# Patient Record
Sex: Female | Born: 1965 | Race: Black or African American | Hispanic: No | Smoking: Never smoker
Health system: Southern US, Community
[De-identification: ages and names within clinical notes are randomized; demographics above are authoritative.]

## PROBLEM LIST (undated history)

## (undated) DIAGNOSIS — I10 Essential (primary) hypertension: Secondary | ICD-10-CM

---

## 1998-10-20 ENCOUNTER — Emergency Department (HOSPITAL_COMMUNITY): Admission: EM | Admit: 1998-10-20 | Discharge: 1998-10-20 | Payer: Self-pay | Admitting: Emergency Medicine

## 2007-08-15 ENCOUNTER — Emergency Department: Payer: Self-pay | Admitting: Emergency Medicine

## 2009-04-04 ENCOUNTER — Emergency Department: Payer: Self-pay | Admitting: Emergency Medicine

## 2009-04-06 ENCOUNTER — Emergency Department: Payer: Self-pay | Admitting: Emergency Medicine

## 2009-07-25 ENCOUNTER — Emergency Department: Payer: Self-pay | Admitting: Emergency Medicine

## 2011-06-22 ENCOUNTER — Emergency Department: Payer: Self-pay | Admitting: Emergency Medicine

## 2013-04-22 ENCOUNTER — Emergency Department: Payer: Self-pay | Admitting: Internal Medicine

## 2013-04-22 LAB — BASIC METABOLIC PANEL
ANION GAP: 5 — AB (ref 7–16)
BUN: 13 mg/dL (ref 7–18)
Calcium, Total: 8.9 mg/dL (ref 8.5–10.1)
Chloride: 104 mmol/L (ref 98–107)
Co2: 30 mmol/L (ref 21–32)
Creatinine: 1.14 mg/dL (ref 0.60–1.30)
EGFR (Non-African Amer.): 57 — ABNORMAL LOW
Glucose: 94 mg/dL (ref 65–99)
OSMOLALITY: 277 (ref 275–301)
Potassium: 3.5 mmol/L (ref 3.5–5.1)
Sodium: 139 mmol/L (ref 136–145)

## 2013-04-22 LAB — CBC WITH DIFFERENTIAL/PLATELET
Basophil #: 0.1 10*3/uL (ref 0.0–0.1)
Basophil %: 1.1 %
EOS ABS: 0.3 10*3/uL (ref 0.0–0.7)
Eosinophil %: 5.4 %
HCT: 37.5 % (ref 35.0–47.0)
HGB: 11.6 g/dL — ABNORMAL LOW (ref 12.0–16.0)
Lymphocyte #: 2.6 10*3/uL (ref 1.0–3.6)
Lymphocyte %: 44.2 %
MCH: 23.3 pg — ABNORMAL LOW (ref 26.0–34.0)
MCHC: 31 g/dL — ABNORMAL LOW (ref 32.0–36.0)
MCV: 75 fL — AB (ref 80–100)
Monocyte #: 0.3 x10 3/mm (ref 0.2–0.9)
Monocyte %: 5.8 %
Neutrophil #: 2.5 10*3/uL (ref 1.4–6.5)
Neutrophil %: 43.5 %
PLATELETS: 327 10*3/uL (ref 150–440)
RBC: 4.98 10*6/uL (ref 3.80–5.20)
RDW: 14.6 % — AB (ref 11.5–14.5)
WBC: 5.8 10*3/uL (ref 3.6–11.0)

## 2013-04-22 LAB — URINALYSIS, COMPLETE
Bacteria: NONE SEEN
Bilirubin,UR: NEGATIVE
GLUCOSE, UR: NEGATIVE mg/dL (ref 0–75)
Ketone: NEGATIVE
Leukocyte Esterase: NEGATIVE
Nitrite: NEGATIVE
PROTEIN: NEGATIVE
Ph: 8 (ref 4.5–8.0)
RBC,UR: 2 /HPF (ref 0–5)
SPECIFIC GRAVITY: 1.012 (ref 1.003–1.030)
Squamous Epithelial: 3
WBC UR: <1 /HPF (ref 0–5)

## 2013-04-22 LAB — TROPONIN I: Troponin-I: <0.02 ng/mL

## 2014-10-03 ENCOUNTER — Encounter: Payer: Self-pay | Admitting: Emergency Medicine

## 2014-10-03 ENCOUNTER — Other Ambulatory Visit: Payer: Self-pay

## 2014-10-03 DIAGNOSIS — R51 Headache: Secondary | ICD-10-CM | POA: Insufficient documentation

## 2014-10-03 DIAGNOSIS — I10 Essential (primary) hypertension: Secondary | ICD-10-CM | POA: Insufficient documentation

## 2014-10-03 DIAGNOSIS — R079 Chest pain, unspecified: Secondary | ICD-10-CM | POA: Insufficient documentation

## 2014-10-03 DIAGNOSIS — R42 Dizziness and giddiness: Secondary | ICD-10-CM | POA: Insufficient documentation

## 2014-10-03 NOTE — ED Notes (Signed)
Pt arrived to the ED for complaints of headache, dizziness and weakness for the last 7 days. Pt reports that she has been feeling under the weather and wants to get it checked. Pt is AOx4, no motor deficits or any stroke like symptoms noted in triage.

## 2014-10-04 ENCOUNTER — Emergency Department: Payer: No Typology Code available for payment source

## 2014-10-04 ENCOUNTER — Emergency Department
Admission: EM | Admit: 2014-10-04 | Discharge: 2014-10-04 | Disposition: A | Discharge status: Home / Self Care | Payer: No Typology Code available for payment source | Attending: Emergency Medicine | Admitting: Emergency Medicine

## 2014-10-04 DIAGNOSIS — R079 Chest pain, unspecified: Secondary | ICD-10-CM

## 2014-10-04 DIAGNOSIS — R42 Dizziness and giddiness: Secondary | ICD-10-CM

## 2014-10-04 HISTORY — DX: Essential (primary) hypertension: I10

## 2014-10-04 LAB — URINALYSIS COMPLETE WITH MICROSCOPIC (ARMC ONLY)
Bilirubin Urine: NEGATIVE
GLUCOSE, UA: NEGATIVE mg/dL
HGB URINE DIPSTICK: NEGATIVE
Ketones, ur: NEGATIVE mg/dL
Leukocytes, UA: NEGATIVE
Nitrite: NEGATIVE
Protein, ur: NEGATIVE mg/dL
Specific Gravity, Urine: 1.024 (ref 1.005–1.030)
pH: 5 (ref 5.0–8.0)

## 2014-10-04 LAB — BASIC METABOLIC PANEL
Anion gap: 5 (ref 5–15)
BUN: 16 mg/dL (ref 6–20)
CHLORIDE: 104 mmol/L (ref 101–111)
CO2: 30 mmol/L (ref 22–32)
CREATININE: 1.18 mg/dL — AB (ref 0.44–1.00)
Calcium: 9.5 mg/dL (ref 8.9–10.3)
GFR calc Af Amer: >60 mL/min (ref >60)
GFR calc non Af Amer: 53 mL/min — ABNORMAL LOW (ref >60)
Glucose, Bld: 96 mg/dL (ref 65–99)
Potassium: 3.8 mmol/L (ref 3.5–5.1)
Sodium: 139 mmol/L (ref 135–145)

## 2014-10-04 LAB — CBC
HCT: 37.2 % (ref 35.0–47.0)
Hemoglobin: 12 g/dL (ref 12.0–16.0)
MCH: 23.9 pg — AB (ref 26.0–34.0)
MCHC: 32.3 g/dL (ref 32.0–36.0)
MCV: 74 fL — AB (ref 80.0–100.0)
PLATELETS: 331 10*3/uL (ref 150–440)
RBC: 5.02 MIL/uL (ref 3.80–5.20)
RDW: 14.5 % (ref 11.5–14.5)
WBC: 5.6 10*3/uL (ref 3.6–11.0)

## 2014-10-04 LAB — TROPONIN I

## 2014-10-04 MED ORDER — ASPIRIN EC 81 MG PO TBEC: 81.0000 mg | DELAYED_RELEASE_TABLET | Freq: Every day | ORAL | Status: AC

## 2014-10-04 NOTE — ED Provider Notes (Signed)
[facility] Emergency Department Provider Note  ____________________________________________  Time seen: Approximately 1220 AM  I have reviewed the triage vital signs and the nursing notes.   HISTORY  Chief Complaint Dizziness; Hypertension; and Headache    HPI Pamela Townsend is a 49 y.o. female with a history of hypertension who presents today with 1 week of intermittent lightheadedness as well as a left-sided headache. She says that she feels lightheaded as if she will pass out for only a second or 2 at a time. She says that she can fill his anytime of day such as when she is sitting. She says that she is also had chest pains which last for only about a second at a time which have been a left-sided chest and shooting down the left arm. There is no associated shortness of breath. No nausea or vomiting. Said that her left-sided headache is about a 3 out of 10 and intermittent. Says is only mild at this time. Denies any chest pain at this time. Denies dizziness despite her nursing note upon arrival. Says that she does not feel the room spinning but her "dizziness" is more of a lightheadedness or "swimmy headedness." She said that she currently feels the sensation in the room but it is mild. She is able to ambulate back to the exam room without difficulty. Denies any coronary artery disease in her family. Says that the symptoms have been ongoing for about a week at this point.   Past Medical History  Diagnosis Date  . Hypertension     There are no active problems to display for this patient.   History reviewed. No pertinent past surgical history.  Current Outpatient Rx  Name  Route  Sig  Dispense  Refill  . ibuprofen (ADVIL,MOTRIN) 200 MG tablet   Oral   Take 400 mg by mouth as needed.           Allergies Review of patient's allergies indicates no known allergies.  No family history on file.  Social History Social History  Substance Use Topics  .  Smoking status: Never Smoker   . Smokeless tobacco: None  . Alcohol Use: Yes    Review of Systems Constitutional: No fever/chills Eyes: No visual changes. ENT: No sore throat. Cardiovascular: As above  Respiratory: Denies shortness of breath. Gastrointestinal: No abdominal pain.  No nausea, no vomiting.  No diarrhea.  No constipation. Genitourinary: Negative for dysuria. Musculoskeletal: Negative for back pain. Skin: Negative for rash. Neurological: Negative for focal weakness or numbness.  10-point ROS otherwise negative.  ____________________________________________   PHYSICAL EXAM:  VITAL SIGNS: ED Triage Vitals  Enc Vitals Group     BP 10/03/14 2334 157/9 mmHg     Pulse Rate 10/03/14 2334 78     Resp 10/03/14 2334 12     Temp 10/03/14 2334 98.1 F (36.7 C)     Temp Source 10/03/14 2334 Oral     SpO2 10/03/14 2334 99 %     Weight 10/03/14 2334 300 lb (136.079 kg)     Height 10/03/14 2334  (1.676 m)     Head Cir --      Peak Flow --      Pain Score 10/03/14 2334 3     Pain Loc --      Pain Edu? --      Excl. in GC? --     Constitutional: Alert and oriented. Well appearing and in no acute distress. Eyes: Conjunctivae are normal.  PERRL. EOMI. Head: Atraumatic. Nose: No congestion/rhinnorhea. Mouth/Throat: Mucous membranes are moist.  Oropharynx non-erythematous. Neck: No stridor.   Cardiovascular: Normal rate, regular rhythm. Grossly normal heart sounds.  Good peripheral circulation. Respiratory: Normal respiratory effort.  No retractions. Lungs CTAB. Gastrointestinal: Soft and nontender. No distention. No abdominal bruits. No CVA tenderness. Musculoskeletal: No lower extremity tenderness nor edema.  No joint effusions. Neurologic:  Normal speech and language. No gross focal neurologic deficits are appreciated. No gait instability. Skin:  Skin is warm, dry and intact. No rash noted. Psychiatric: Mood and affect are normal. Speech and behavior are  normal.  ____________________________________________   LABS (all labs ordered are listed, but only abnormal results are displayed)  Labs Reviewed  BASIC METABOLIC PANEL - Abnormal; Notable for the following:    Creatinine, Ser 1.18 (*)    GFR calc non Af Amer 53 (*)    All other components within normal limits  CBC - Abnormal; Notable for the following:    MCV 74.0 (*)    MCH 23.9 (*)    All other components within normal limits  URINALYSIS COMPLETEWITH MICROSCOPIC ([facility] ONLY) - Abnormal; Notable for the following:    Color, Urine YELLOW (*)    APPearance CLEAR (*)    Bacteria, UA RARE (*)    Squamous Epithelial / LPF 0-5 (*)    All other components within normal limits  TROPONIN I   ____________________________________________  EKG  ED ECG REPORT I, Arelia Longest, the attending physician, personally viewed and interpreted this ECG.   Date: 10/04/2014  EKG Time: 2334  Rate: 82  Rhythm: normal EKG, normal sinus rhythm  Axis: Normal axis  Intervals:none  ST&T Change: No ST elevations or depressions. No abnormal T-wave inversions.  ____________________________________________  RADIOLOGY  No evidence of acute cardiopulmonary disease. I personally reviewed the images. ____________________________________________   PROCEDURES   ____________________________________________   INITIAL IMPRESSION / ASSESSMENT AND PLAN / ED COURSE  Pertinent labs & imaging results that were available during my care of the patient were reviewed by me and considered in my medical decision making (see chart for details).  ----------------------------------------- 1:35 AM on 10/04/2014 -----------------------------------------  Discussed case with cardiology, Dr. Tillie Rung, and her to establish follow-up for the patient. He says that he will pass the patient's personal information along to the office staff. Patient should also call the office to schedule an appointment within the  next few days.  Unclear etiology of patient's symptoms however does have history of hypertension and is almost 49 years old. Feel that she should follow up with cardiologist and that the symptoms could possibly be cardiac. Does not of any focal neuro findings. Has normal gait. No ataxia on finger to nose. No nystagmus. Doubt that this is from a stroke. No sudden onset thunderclap quality of the headache. Repeat blood pressure 120s over 90s. Also unclear cause for lightheadedness. No arrhythmias seen on the EKG and no abnormal intervals to elicit a concern for extended cardiac monitoring as an inpatient.  ----------------------------------------- 2:00 AM on 10/04/2014 -----------------------------------------  She resting comfortably at this time. Asleep when I entered the room. Discussed the lab as well as imaging results. Patient to follow up with cardiology. We'll discharge to home. ____________________________________________   FINAL CLINICAL IMPRESSION(S) / ED DIAGNOSES  Acute headache. Acute chest pain. Acute lightheadedness. Initial visit.    Myrna Blazer, MD 10/04/14 0200

## 2014-10-04 NOTE — Discharge Instructions (Signed)

## 2014-10-04 NOTE — ED Notes (Signed)
Lab contacted to add on troponin from previous blood draw

## 2015-06-20 ENCOUNTER — Emergency Department
Admission: EM | Admit: 2015-06-20 | Discharge: 2015-06-20 | Disposition: A | Discharge status: Home / Self Care | Payer: BC Managed Care – PPO | Attending: Emergency Medicine | Admitting: Emergency Medicine

## 2015-06-20 DIAGNOSIS — Z791 Long term (current) use of non-steroidal anti-inflammatories (NSAID): Secondary | ICD-10-CM | POA: Diagnosis not present

## 2015-06-20 DIAGNOSIS — I10 Essential (primary) hypertension: Secondary | ICD-10-CM | POA: Diagnosis present

## 2015-06-20 DIAGNOSIS — Z7982 Long term (current) use of aspirin: Secondary | ICD-10-CM | POA: Insufficient documentation

## 2015-06-20 LAB — CBC
HCT: 34.9 % — ABNORMAL LOW (ref 35.0–47.0)
HEMOGLOBIN: 11.3 g/dL — AB (ref 12.0–16.0)
MCH: 23.8 pg — AB (ref 26.0–34.0)
MCHC: 32.4 g/dL (ref 32.0–36.0)
MCV: 73.6 fL — ABNORMAL LOW (ref 80.0–100.0)
PLATELETS: 305 10*3/uL (ref 150–440)
RBC: 4.75 MIL/uL (ref 3.80–5.20)
RDW: 14.5 % (ref 11.5–14.5)
WBC: 5.5 10*3/uL (ref 3.6–11.0)

## 2015-06-20 LAB — BASIC METABOLIC PANEL
ANION GAP: 8 (ref 5–15)
BUN: 10 mg/dL (ref 6–20)
CALCIUM: 9.3 mg/dL (ref 8.9–10.3)
CHLORIDE: 103 mmol/L (ref 101–111)
CO2: 27 mmol/L (ref 22–32)
CREATININE: 1.04 mg/dL — AB (ref 0.44–1.00)
GFR calc non Af Amer: >60 mL/min (ref >60)
Glucose, Bld: 104 mg/dL — ABNORMAL HIGH (ref 65–99)
Potassium: 3.5 mmol/L (ref 3.5–5.1)
SODIUM: 138 mmol/L (ref 135–145)

## 2015-06-20 LAB — TROPONIN I

## 2015-06-20 MED ORDER — HYDROCHLOROTHIAZIDE 25 MG PO TABS: 25.0000 mg | ORAL_TABLET | Freq: Every day | ORAL | Status: AC

## 2015-06-20 MED ORDER — HYDROCHLOROTHIAZIDE 25 MG PO TABS
25.0000 mg | ORAL_TABLET | Freq: Every day | ORAL | Status: DC
  Administered 2015-06-20: 25 mg via ORAL
  Filled 2015-06-20: qty 1

## 2015-06-20 NOTE — Discharge Instructions (Signed)
Hypertension Hypertension, commonly called high blood pressure, is when the force of blood pumping through your arteries is too strong. Your arteries are the blood vessels that carry blood from your heart throughout your body. A blood pressure reading consists of a higher number over a lower number, such as 110/72. The higher number (systolic) is the pressure inside your arteries when your heart pumps. The lower number (diastolic) is the pressure inside your arteries when your heart relaxes. Ideally you want your blood pressure below 120/80. Hypertension forces your heart to work harder to pump blood. Your arteries may become narrow or stiff. Having untreated or uncontrolled hypertension can cause heart attack, stroke, kidney disease, and other problems. RISK FACTORS Some risk factors for high blood pressure are controllable. Others are not.  Risk factors you cannot control include:   Race. You may be at higher risk if you are African American.  Age. Risk increases with age.  Gender. Men are at higher risk than women before age 45 years. After age 65, women are at higher risk than men. Risk factors you can control include:  Not getting enough exercise or physical activity.  Being overweight.  Getting too much fat, sugar, calories, or salt in your diet.  Drinking too much alcohol. SIGNS AND SYMPTOMS Hypertension does not usually cause signs or symptoms. Extremely high blood pressure (hypertensive crisis) may cause headache, anxiety, shortness of breath, and nosebleed. DIAGNOSIS To check if you have hypertension, your health care provider will measure your blood pressure while you are seated, with your arm held at the level of your heart. It should be measured at least twice using the same arm. Certain conditions can cause a difference in blood pressure between your right and left arms. A blood pressure reading that is higher than normal on one occasion does not mean that you need treatment. If  it is not clear whether you have high blood pressure, you may be asked to return on a different day to have your blood pressure checked again. Or, you may be asked to monitor your blood pressure at home for 1 or more weeks. TREATMENT Treating high blood pressure includes making lifestyle changes and possibly taking medicine. Living a healthy lifestyle can help lower high blood pressure. You may need to change some of your habits. Lifestyle changes may include:  Following the DASH diet. This diet is high in fruits, vegetables, and whole grains. It is low in salt, red meat, and added sugars.  Keep your sodium intake below 2,300 mg per day.  Getting at least 30-45 minutes of aerobic exercise at least 4 times per week.  Losing weight if necessary.  Not smoking.  Limiting alcoholic beverages.  Learning ways to reduce stress. Your health care provider may prescribe medicine if lifestyle changes are not enough to get your blood pressure under control, and if one of the following is true:  You are 18-59 years of age and your systolic blood pressure is above 140.  You are 60 years of age or older, and your systolic blood pressure is above 150.  Your diastolic blood pressure is above 90.  You have diabetes, and your systolic blood pressure is over 140 or your diastolic blood pressure is over 90.  You have kidney disease and your blood pressure is above 140/90.  You have heart disease and your blood pressure is above 140/90. Your personal target blood pressure may vary depending on your medical conditions, your age, and other factors. HOME CARE INSTRUCTIONS    Have your blood pressure rechecked as directed by your health care provider.   Take medicines only as directed by your health care provider. Follow the directions carefully. Blood pressure medicines must be taken as prescribed. The medicine does not work as well when you skip doses. Skipping doses also puts you at risk for  problems.  Do not smoke.   Monitor your blood pressure at home as directed by your health care provider. SEEK MEDICAL CARE IF:   You think you are having a reaction to medicines taken.  You have recurrent headaches or feel dizzy.  You have swelling in your ankles.  You have trouble with your vision. SEEK IMMEDIATE MEDICAL CARE IF:  You develop a severe headache or confusion.  You have unusual weakness, numbness, or feel faint.  You have severe chest or abdominal pain.  You vomit repeatedly.  You have trouble breathing. MAKE SURE YOU:   Understand these instructions.  Will watch your condition.  Will get help right away if you are not doing well or get worse.   This information is not intended to replace advice given to you by your health care provider. Make sure you discuss any questions you have with your health care provider.   Document Released: 02/01/2005 Document Revised: 06/18/2014 Document Reviewed: 11/24/2012 Elsevier Interactive Patient Education 2016 [employer]  

## 2015-06-20 NOTE — ED Provider Notes (Signed)
[facility] Emergency Department Provider Note        Time seen: ----------------------------------------- 8:13 PM on 06/20/2015 -----------------------------------------    I have reviewed the triage vital signs and the nursing notes.   HISTORY  Chief Complaint Hypertension    HPI Pamela Townsend is a 50 y.o. female who presents ER for high blood pressure. Patient has history of hypertension years ago that she never took any medication for. She reports intermittent headache and dizziness. She denies fevers, chills, chest pain, shortness of breath, nausea vomiting or diarrhea. Patient went to [facility] and checked her blood pressure and noted it was elevated both yesterday and today.   Past Medical History  Diagnosis Date  . Hypertension     There are no active problems to display for this patient.   History reviewed. No pertinent past surgical history.  Allergies Review of patient's allergies indicates no known allergies.  Social History Social History  Substance Use Topics  . Smoking status: Never Smoker   . Smokeless tobacco: None  . Alcohol Use: Yes    Review of Systems Constitutional: Negative for fever. Eyes: Negative for visual changes. ENT: Negative for sore throat. Cardiovascular: Negative for chest pain. Respiratory: Negative for shortness of breath. Gastrointestinal: Negative for abdominal pain, vomiting and diarrhea. Genitourinary: Negative for dysuria. Musculoskeletal: Negative for back pain. Skin: Negative for rash. Neurological: Negative for headaches, Positive for dizziness  10-point ROS otherwise negative.  ____________________________________________   PHYSICAL EXAM:  VITAL SIGNS: ED Triage Vitals  Enc Vitals Group     BP 06/20/15 1840 178/99 mmHg     Pulse Rate 06/20/15 1840 100     Resp 06/20/15 1840 16     Temp 06/20/15 1840 98.6 F (37 C)     Temp Source 06/20/15 1840 Oral     SpO2 06/20/15 1840 98 %   Weight 06/20/15 1840 294 lb (133.358 kg)     Height 06/20/15 1840 5\' 6"  (1.676 m)     Head Cir --      Peak Flow --      Pain Score 06/20/15 1841 2     Pain Loc --      Pain Edu? --      Excl. in GC? --     Constitutional: Alert and oriented. Well appearing and in no distress. Eyes: Conjunctivae are normal. PERRL. Normal extraocular movements. ENT   Head: Normocephalic and atraumatic.   Nose: No congestion/rhinnorhea.   Mouth/Throat: Mucous membranes are moist.   Neck: No stridor. Cardiovascular: Normal rate, regular rhythm. No murmurs, rubs, or gallops. Respiratory: Normal respiratory effort without tachypnea nor retractions. Breath sounds are clear and equal bilaterally. No wheezes/rales/rhonchi. Gastrointestinal: Soft and nontender. Normal bowel sounds Musculoskeletal: Nontender with normal range of motion in all extremities. No lower extremity tenderness nor edema. Neurologic:  Normal speech and language. No gross focal neurologic deficits are appreciated.  Skin:  Skin is warm, dry and intact. No rash noted. Psychiatric: Mood and affect are normal. Speech and behavior are normal.  ____________________________________________  EKG: Interpreted by me. Sinus rhythm with a rate of 90 bpm, normal urine, normal QRS, normal QT interval. Normal axis.  ____________________________________________  ED COURSE:  Pertinent labs & imaging results that were available during my care of the patient were reviewed by me and considered in my medical decision making (see chart for details). Patient presents and is in no acute distress, will check basic labs and reevaluate. ____________________________________________    LABS (pertinent positives/negatives)  Labs Reviewed  BASIC METABOLIC PANEL - Abnormal; Notable for the following:    Glucose, Bld 104 (*)    Creatinine, Ser 1.04 (*)    All other components within normal limits  CBC - Abnormal; Notable for the following:     Hemoglobin 11.3 (*)    HCT 34.9 (*)    MCV 73.6 (*)    MCH 23.8 (*)    All other components within normal limits  TROPONIN I   ____________________________________________  FINAL ASSESSMENT AND PLAN  Hypertension  Plan: Patient with labs as dictated above. Patient is in no acute distress. Blood pressure found to be elevated here, she'll be started on HCTZ and referred to [facility] for follow-up.   [name], MD   Note: This dictation was prepared with Dragon dictation. Any transcriptional errors that result from this process are unintentional   [name], MD 06/20/15 2015

## 2015-06-20 NOTE — ED Notes (Signed)
Pt arrives to ER via POV c/o high blood pressure. PT hx of hypertension " years ago", never took any mediation for blood pressure. Pt reports intermittent HA and dizziness. Pt alert and oriented X4, active, cooperative, pt in NAD. RR even and unlabored, color WNL.

## 2016-11-26 DIAGNOSIS — N390 Urinary tract infection, site not specified: Secondary | ICD-10-CM | POA: Insufficient documentation

## 2016-11-26 DIAGNOSIS — R42 Dizziness and giddiness: Secondary | ICD-10-CM | POA: Insufficient documentation

## 2016-11-26 DIAGNOSIS — I10 Essential (primary) hypertension: Secondary | ICD-10-CM | POA: Insufficient documentation

## 2016-11-26 DIAGNOSIS — E860 Dehydration: Secondary | ICD-10-CM | POA: Insufficient documentation

## 2016-11-26 LAB — URINALYSIS, COMPLETE (UACMP) WITH MICROSCOPIC
BILIRUBIN URINE: NEGATIVE
GLUCOSE, UA: NEGATIVE mg/dL
HGB URINE DIPSTICK: NEGATIVE
Ketones, ur: NEGATIVE mg/dL
NITRITE: NEGATIVE
PH: 5 (ref 5.0–8.0)
Protein, ur: 30 mg/dL — AB
SPECIFIC GRAVITY, URINE: 1.021 (ref 1.005–1.030)

## 2016-11-26 LAB — BASIC METABOLIC PANEL
Anion gap: 11 (ref 5–15)
BUN: 14 mg/dL (ref 6–20)
CO2: 30 mmol/L (ref 22–32)
CREATININE: 1.45 mg/dL — AB (ref 0.44–1.00)
Calcium: 10 mg/dL (ref 8.9–10.3)
Chloride: 97 mmol/L — ABNORMAL LOW (ref 101–111)
GFR, EST AFRICAN AMERICAN: 47 mL/min — AB (ref >60)
GFR, EST NON AFRICAN AMERICAN: 41 mL/min — AB (ref >60)
Glucose, Bld: 145 mg/dL — ABNORMAL HIGH (ref 65–99)
Potassium: 3.5 mmol/L (ref 3.5–5.1)
SODIUM: 138 mmol/L (ref 135–145)

## 2016-11-26 LAB — CBC
HCT: 37.4 % (ref 35.0–47.0)
Hemoglobin: 12 g/dL (ref 12.0–16.0)
MCH: 24.1 pg — ABNORMAL LOW (ref 26.0–34.0)
MCHC: 32.2 g/dL (ref 32.0–36.0)
MCV: 74.8 fL — AB (ref 80.0–100.0)
PLATELETS: 346 10*3/uL (ref 150–440)
RBC: 4.99 MIL/uL (ref 3.80–5.20)
RDW: 14.2 % (ref 11.5–14.5)
WBC: 6.5 10*3/uL (ref 3.6–11.0)

## 2016-11-26 LAB — POCT PREGNANCY, URINE: Preg Test, Ur: NEGATIVE

## 2016-11-26 NOTE — ED Triage Notes (Signed)
Pt arrives to ED c/o of dizziness. Denies vision changes. Alert, oriented, ambulatory with steady gait. Talking in complete sentences. States symptoms began while eating supper.

## 2016-11-27 ENCOUNTER — Emergency Department
Admission: EM | Admit: 2016-11-27 | Discharge: 2016-11-27 | Disposition: A | Discharge status: Home / Self Care | Payer: Self-pay | Attending: Emergency Medicine | Admitting: Emergency Medicine

## 2016-11-27 ENCOUNTER — Emergency Department: Payer: Self-pay

## 2016-11-27 DIAGNOSIS — E860 Dehydration: Secondary | ICD-10-CM

## 2016-11-27 DIAGNOSIS — N390 Urinary tract infection, site not specified: Secondary | ICD-10-CM

## 2016-11-27 DIAGNOSIS — R42 Dizziness and giddiness: Secondary | ICD-10-CM

## 2016-11-27 LAB — GLUCOSE, CAPILLARY: Glucose-Capillary: 99 mg/dL (ref 65–99)

## 2016-11-27 LAB — TROPONIN I: Troponin I: <0.03 ng/mL (ref <0.03)

## 2016-11-27 MED ORDER — ONDANSETRON 4 MG PO TBDP: 4.0000 mg | ORAL_TABLET | Freq: Three times a day (TID) | ORAL | 0 refills | Status: DC | PRN

## 2016-11-27 MED ORDER — FOSFOMYCIN TROMETHAMINE 3 G PO PACK
3.0000 g | PACK | Freq: Once | ORAL | Status: AC
  Administered 2016-11-27: 3 g via ORAL
  Filled 2016-11-27: qty 3

## 2016-11-27 MED ORDER — MECLIZINE HCL 25 MG PO TABS: 25.0000 mg | ORAL_TABLET | Freq: Three times a day (TID) | ORAL | 0 refills | Status: DC | PRN

## 2016-11-27 MED ORDER — SODIUM CHLORIDE 0.9 % IV BOLUS (SEPSIS)
1000.0000 mL | Freq: Once | INTRAVENOUS | Status: AC
  Administered 2016-11-27: 1000 mL via INTRAVENOUS

## 2016-11-27 NOTE — Discharge Instructions (Signed)
1. You were treated with an antibiotic for your UTI.  2. You may take medicines as needed for dizziness and nausea (meclizine/Zofran #20). 3. Drink plenty of fluids daily. 4. Return to the ER for worsening symptoms, persistent vomiting, difficulty breathing or other concerns.

## 2016-11-27 NOTE — ED Notes (Signed)
No topaz in room - pt refused to wait for copies to sign

## 2016-11-27 NOTE — ED Provider Notes (Signed)
[facility] Emergency Department Provider Note   ____________________________________________   First MD Initiated Contact with Patient 11/27/16 [phone]     (approximate)  I have reviewed the triage vital signs and the nursing notes.   HISTORY  Chief Complaint Dizziness    HPI Pamela Townsend is a 51 y.o. female who presents to the ED from home with a chief complaint of dizziness. States she was at dinner when she "didn't feel right". Describes a sensation of both vertigo and lightheadedness which is worsened with positioning. Had mild, frontal headache which is now resolved. Denies associated fever, chills, vision changes, neck pain, chest pain, shortness of breath, abdominal pain, nausea, vomiting, diarrhea. Denies recent travel or trauma.   Past Medical History:  Diagnosis Date  . Hypertension     There are no active problems to display for this patient.   History reviewed. No pertinent surgical history.  Prior to Admission medications   Medication Sig Start Date End Date Taking? Authorizing Provider  hydrochlorothiazide (HYDRODIURIL) 25 MG tablet Take 1 tablet (25 mg total) by mouth daily. 06/20/15   [name], MD  ibuprofen (ADVIL,MOTRIN) 200 MG tablet Take 400 mg by mouth as needed.    [provider]  meclizine (ANTIVERT) 25 MG tablet Take 1 tablet (25 mg total) by mouth 3 (three) times daily as needed for dizziness or nausea. 11/27/16   Irean Hong, MD  ondansetron (ZOFRAN ODT) 4 MG disintegrating tablet Take 1 tablet (4 mg total) by mouth every 8 (eight) hours as needed for nausea or vomiting. 11/27/16   Irean Hong, MD    Allergies Patient has no known allergies.  History reviewed. No pertinent family history.  Social History Social History  Substance Use Topics  . Smoking status: Never Smoker  . Smokeless tobacco: Not on file  . Alcohol use Yes    Review of Systems  Constitutional: No fever/chills. Eyes: No  visual changes. ENT: No sore throat. Cardiovascular: Denies chest pain. Respiratory: Denies shortness of breath. Gastrointestinal: No abdominal pain.  No nausea, no vomiting.  No diarrhea.  No constipation. Genitourinary: Negative for dysuria. Musculoskeletal: Negative for back pain. Skin: Negative for rash. Neurological: positive for dizziness and headache. negative for focal weakness or numbness.   ____________________________________________   PHYSICAL EXAM:  VITAL SIGNS: ED Triage Vitals [11/26/16 2254]  Enc Vitals Group     BP 127/89     Pulse Rate 92     Resp 18     Temp 97.9 F (36.6 C)     Temp Source Oral     SpO2 99 %     Weight (!) 305 lb (138.3 kg)     Height  (1.676 m)     Head Circumference      Peak Flow      Pain Score 0     Pain Loc      Pain Edu?      Excl. in GC?     Constitutional: Alert and oriented. Well appearing and in no acute distress. Eyes: Conjunctivae are normal. PERRL. EOMI. Head: Atraumatic. Ears: Bilateral TMs within normal limits. Nose: No congestion/rhinnorhea. Mouth/Throat: Mucous membranes are moist.  Oropharynx non-erythematous. Neck: No stridor. Supple neck without meningismus. No carotid bruits.  Cardiovascular: Normal rate, regular rhythm. Grossly normal heart sounds.  Good peripheral circulation. Respiratory: Normal respiratory effort.  No retractions. Lungs CTAB. Gastrointestinal: Soft and nontender. No distention. No abdominal bruits. No CVA tenderness. Musculoskeletal: No lower  extremity tenderness nor edema.  No joint effusions. Neurologic:  Alert and oriented 4. CN II-XII grossly intact. Normal speech and language. No gross focal neurologic deficits are appreciated.  Skin:  Skin is warm, dry and intact. No rash noted. Psychiatric: Mood and affect are normal. Speech and behavior are normal.  ____________________________________________   LABS (all labs ordered are listed, but only abnormal results are  displayed)  Labs Reviewed  BASIC METABOLIC PANEL - Abnormal; Notable for the following:       Result Value   Chloride 97 (*)    Glucose, Bld 145 (*)    Creatinine, Ser 1.45 (*)    GFR calc non Af Amer 41 (*)    GFR calc Af Amer 47 (*)    All other components within normal limits  CBC - Abnormal; Notable for the following:    MCV 74.8 (*)    MCH 24.1 (*)    All other components within normal limits  URINALYSIS, COMPLETE (UACMP) WITH MICROSCOPIC - Abnormal; Notable for the following:    Color, Urine AMBER (*)    APPearance HAZY (*)    Protein, ur 30 (*)    Leukocytes, UA SMALL (*)    Bacteria, UA RARE (*)    Squamous Epithelial / LPF 6-30 (*)    All other components within normal limits  TROPONIN I  GLUCOSE, CAPILLARY  CBG MONITORING, ED  POCT PREGNANCY, URINE   ____________________________________________  EKG  ED ECG REPORT I, SUNG,JADE J, the attending physician, personally viewed and interpreted this ECG.   Date: 11/27/2016  EKG Time: 2301  Rate: 87  Rhythm: normal EKG, normal sinus rhythm  Axis: normal  Intervals:none  ST&T Change: nonspecific  ____________________________________________  RADIOLOGY  Ct Head Wo Contrast  Result Date: 11/27/2016 CLINICAL DATA:  Acute onset of dizziness.  Initial encounter. EXAM: CT HEAD WITHOUT CONTRAST TECHNIQUE: Contiguous axial images were obtained from the base of the skull through the vertex without intravenous contrast. COMPARISON:  None. FINDINGS: Brain: No evidence of acute infarction, hemorrhage, hydrocephalus, extra-axial collection or mass lesion/mass effect. The posterior fossa, including the cerebellum, brainstem and fourth ventricle, is within normal limits. The third and lateral ventricles, and basal ganglia are unremarkable in appearance. The cerebral hemispheres are symmetric in appearance, with normal gray-white differentiation. No mass effect or midline shift is seen. Vascular: No hyperdense vessel or unexpected  calcification. Skull: There is no evidence of fracture; visualized osseous structures are unremarkable in appearance. Sinuses/Orbits: The visualized portions of the orbits are within normal limits. The paranasal sinuses and mastoid air cells are well-aerated. Other: No significant soft tissue abnormalities are seen. IMPRESSION: Unremarkable noncontrast CT of the head. Electronically Signed   By: Roanna Raider M.D.   On: 11/27/2016 07:18    ____________________________________________   PROCEDURES  Procedure(s) performed: None  Procedures  Critical Care performed: No  ____________________________________________   INITIAL IMPRESSION / ASSESSMENT AND PLAN / ED COURSE  As part of my medical decision making, I reviewed the following data within the electronic MEDICAL RECORD NUMBER Nursing notes reviewed and incorporated, Labs reviewed, EKG interpreted and Notes from prior ED visits.   51 year old female who presents with dizziness. Differential diagnosis includes, but is not limited to, neurological, cardiac, infectious etiologies. Laboratory and urinalysis results remarkable for mild dehydration and UTI. Will administer fosfomycin packet for UTI, orthostatic vital signs, IV fluids, CT head and reassess.  Clinical Course as of Nov 28 730  Sat Nov 27, 2016  1610 Updated patient of  orthostatics and CT imaging results. She is overall feeling much better. Strict return precautions given. Patient verbalizes understanding and agrees with plan of care.  [JS]    Clinical Course User Index [JS] Irean Hong, MD     ____________________________________________   FINAL CLINICAL IMPRESSION(S) / ED DIAGNOSES  Final diagnoses:  Dizziness  Dehydration  Lower urinary tract infectious disease      NEW MEDICATIONS STARTED DURING THIS VISIT:  New Prescriptions   MECLIZINE (ANTIVERT) 25 MG TABLET    Take 1 tablet (25 mg total) by mouth 3 (three) times daily as needed for dizziness or nausea.     ONDANSETRON (ZOFRAN ODT) 4 MG DISINTEGRATING TABLET    Take 1 tablet (4 mg total) by mouth every 8 (eight) hours as needed for nausea or vomiting.     Note:  This document was prepared using Dragon voice recognition software and may include unintentional dictation errors.    Irean Hong, MD 11/27/16 [phone]

## 2016-11-27 NOTE — ED Notes (Signed)
Pt states that she was out to eat with friends then she began to feel light headed and "weird". She also states she had dizziness that lasted for a few seconds.

## 2016-12-01 ENCOUNTER — Ambulatory Visit: Payer: Self-pay

## 2016-12-15 ENCOUNTER — Ambulatory Visit: Payer: Self-pay | Attending: Oncology

## 2016-12-15 ENCOUNTER — Ambulatory Visit
Admission: RE | Admit: 2016-12-15 | Discharge: 2016-12-15 | Disposition: A | Discharge status: Home / Self Care | Payer: Self-pay | Source: Ambulatory Visit | Attending: Oncology | Admitting: Oncology

## 2016-12-15 VITALS — BP 120/76 | HR 78 | Temp 94.8°F | Resp 18 | Ht 66.0 in | Wt 301.0 lb

## 2016-12-15 DIAGNOSIS — Z0000 Encounter for general adult medical examination without abnormal findings: Secondary | ICD-10-CM

## 2016-12-15 NOTE — Progress Notes (Signed)
Subjective:     Patient ID: Pamela Townsend, female   DOB: [birth year], 51 y.o.   MRN: [id number]  HPI   Review of Systems     Objective:   Physical Exam  Pulmonary/Chest: Right breast exhibits no inverted nipple, no mass, no nipple discharge, no skin change and no tenderness. Left breast exhibits no inverted nipple, no mass, no nipple discharge, no skin change and no tenderness. Breasts are symmetrical.  Genitourinary: No labial fusion. There is no rash, tenderness, lesion or injury on the right labia. There is no rash, tenderness, lesion or injury on the left labia. Uterus is not deviated, not enlarged, not fixed and not tender. Cervix exhibits no motion tenderness, no discharge and no friability. Right adnexum displays no mass, no tenderness and no fullness. Left adnexum displays no mass, no tenderness and no fullness. No erythema, tenderness or bleeding in the vagina. No foreign body in the vagina. No signs of injury around the vagina. No vaginal discharge found.       Assessment:     51 year old patient presents for [facility] clinic visitPatient screened, and meets B[facility]CP eligibility.  Patient does not have insurance, Medicare or Medicaid.  Handout given on Affordable Care Act. Instructed patient on breast self-exam using teach back method.  CBE unremarkable.  No mass or lump palpated.  Pelvic exam normal.  Patient [facility], but they report they were in a duifferent system,and are unable to obtain record.  Patient drives a school bus for elementary, middle, and high school.    Plan:     Sent for bilateral screening mammogram.  Specimen collected for pap.

## 2016-12-21 LAB — PAP LB AND HPV HIGH-RISK
HPV, high-risk: NEGATIVE
PAP Smear Comment: 0

## 2016-12-23 NOTE — Progress Notes (Signed)
Letter mailed to patient to notify of normal mammogram, and pap smear results.  Copy to [facility].  Patient to return in one year for annual screening.

## 2017-03-22 ENCOUNTER — Emergency Department
Admission: EM | Admit: 2017-03-22 | Discharge: 2017-03-23 | Disposition: A | Discharge status: Home / Self Care | Payer: Self-pay | Attending: Emergency Medicine | Admitting: Emergency Medicine

## 2017-03-22 ENCOUNTER — Other Ambulatory Visit: Payer: Self-pay

## 2017-03-22 DIAGNOSIS — M25512 Pain in left shoulder: Secondary | ICD-10-CM | POA: Insufficient documentation

## 2017-03-22 DIAGNOSIS — I10 Essential (primary) hypertension: Secondary | ICD-10-CM | POA: Insufficient documentation

## 2017-03-22 DIAGNOSIS — Z79899 Other long term (current) drug therapy: Secondary | ICD-10-CM | POA: Insufficient documentation

## 2017-03-22 NOTE — ED Provider Notes (Signed)
[facility] Emergency Department Provider Note   First MD Initiated Contact with Patient 03/22/17 2315     (approximate)  I have reviewed the triage vital signs and the nursing notes.   HISTORY  Chief Complaint Shoulder Pain    HPI Marylou MccoyFelicia G Boakye is a 52 y.o. female with history of hypertension resents emergency department with nontraumatic left shoulder pain radiating down the left arm to forearm.  Patient denies any chest pain no shortness of breath.  Patient states current pain score is 8 out of 10.  Patient denies any aggravating or alleviating factors for her pain.   Past Medical History:  Diagnosis Date  . Hypertension     There are no active problems to display for this patient.     Prior to Admission medications   Medication Sig Start Date End Date Taking? Authorizing Provider  hydrochlorothiazide (HYDRODIURIL) 25 MG tablet Take 1 tablet (25 mg total) by mouth daily. 06/20/15   Emily FilbertWilliams, Jonathan E, MD  ibuprofen (ADVIL,MOTRIN) 200 MG tablet Take 400 mg by mouth as needed.    [provider]  meclizine (ANTIVERT) 25 MG tablet Take 1 tablet (25 mg total) by mouth 3 (three) times daily as needed for dizziness or nausea. 11/27/16   Irean HongSung, Jade J, MD  ondansetron (ZOFRAN ODT) 4 MG disintegrating tablet Take 1 tablet (4 mg total) by mouth every 8 (eight) hours as needed for nausea or vomiting. 11/27/16   Irean HongSung, Jade J, MD  traMADol (ULTRAM) 50 MG tablet Take 1 tablet (50 mg total) by mouth every 6 (six) hours as needed. 03/23/17 03/23/18  Darci CurrentBrown,  N, MD    Allergies No known drug allergies  No family history on file.  Social History Social History   Tobacco Use  . Smoking status: Never Smoker  Substance Use Topics  . Alcohol use: Yes  . Drug use: No    Review of Systems Constitutional: No fever/chills Eyes: No visual changes. ENT: No sore throat. Cardiovascular: Denies chest pain. Respiratory: Denies shortness of  breath. Gastrointestinal: No abdominal pain.  No nausea, no vomiting.  No diarrhea.  No constipation. Genitourinary: Negative for dysuria. Musculoskeletal: Negative for neck pain.  Negative for back pain.  Positive for left shoulder/arm pain Integumentary: Negative for rash. Neurological: Negative for headaches, focal weakness or numbness.   ____________________________________________   PHYSICAL EXAM:  VITAL SIGNS: ED Triage Vitals  Enc Vitals Group     BP 03/22/17 2320 (!) 143/93     Pulse Rate 03/22/17 2320 82     Resp 03/22/17 2320 18     Temp 03/22/17 2320 97.9 F (36.6 C)     Temp Source 03/22/17 2320 Oral     SpO2 03/22/17 2320 98 %     Weight 03/22/17 2311 (!) 140.6 kg (310 lb)     Height 03/22/17 2311 1.676 m (5\' 6" )     Head Circumference --      Peak Flow --      Pain Score 03/22/17 2311 8     Pain Loc --      Pain Edu? --      Excl. in GC? --     Constitutional: Alert and oriented. Well appearing and in no acute distress. Eyes: Conjunctivae are normal.  Head: Atraumatic. Mouth/Throat: Mucous membranes are moist.  Oropharynx non-erythematous. Neck: No stridor.   Cardiovascular: Normal rate, regular rhythm. Good peripheral circulation. Grossly normal heart sounds. Respiratory: Normal respiratory effort.  No retractions. Lungs CTAB.  Gastrointestinal: Soft and nontender. No distention.   Musculoskeletal: No lower extremity tenderness nor edema. No gross deformities of extremities. Neurologic:  Normal speech and language. No gross focal neurologic deficits are appreciated.  Skin:  Skin is warm, dry and intact. No rash noted. Psychiatric: Mood and affect are normal. Speech and behavior are normal.  ____________________________________________   LABS (all labs ordered are listed, but only abnormal results are displayed)  Labs Reviewed  BASIC METABOLIC PANEL - Abnormal; Notable for the following components:      Result Value   Sodium 134 (*)    Chloride 99  (*)    Creatinine, Ser 1.15 (*)    GFR calc non Af Amer 54 (*)    All other components within normal limits  CBC - Abnormal; Notable for the following components:   Hemoglobin 11.9 (*)    MCV 75.1 (*)    MCH 24.4 (*)    RDW 14.6 (*)    All other components within normal limits  TROPONIN I   ____________________________________________  EKG  ED ECG REPORT I, Berino N Adelaido Nicklaus, the attending physician, personally viewed and interpreted this ECG.   Date: 03/23/2017  EKG Time: 11:20 PM  Rate: 86  Rhythm: Normal sinus rhythm  Axis: Normal  Intervals: Normal  ST&T Change: None  Procedures   ____________________________________________   INITIAL IMPRESSION / ASSESSMENT AND PLAN / ED COURSE  As part of my medical decision making, I reviewed the following data within the electronic MEDICAL RECORD NUMBER44 year old female present with above-stated history of left shoulder pain.  Patient has no chest pain.  EKG revealed no evidence of ST segment elevation.  Suspect musculoskeletal in pain.  Patient given tramadol emergency department will be prescribed the same for home. ____________________________________________  FINAL CLINICAL IMPRESSION(S) / ED DIAGNOSES  Final diagnoses:  Acute pain of left shoulder     MEDICATIONS GIVEN DURING THIS VISIT:  Medications  ketorolac (TORADOL) tablet 10 mg (10 mg Oral Given 03/23/17 0058)     ED Discharge Orders        Ordered    traMADol (ULTRAM) 50 MG tablet  Every 6 hours PRN     03/23/17 0053       Note:  This document was prepared using Dragon voice recognition software and may include unintentional dictation errors.    Darci Current, MD 03/23/17 [phone]

## 2017-03-22 NOTE — ED Triage Notes (Signed)
Pt in with co left arm and left shoulder pain that started today, states pain is constant. Pt denies any hx of the same.

## 2017-03-22 NOTE — ED Notes (Signed)
Pt denies SHOB or CP, states hx of HTN and is on 2 BP medications.

## 2017-03-22 NOTE — ED Notes (Signed)
ED Provider at bedside. 

## 2017-03-23 LAB — BASIC METABOLIC PANEL
Anion gap: 9 (ref 5–15)
BUN: 19 mg/dL (ref 6–20)
CO2: 26 mmol/L (ref 22–32)
Calcium: 9.1 mg/dL (ref 8.9–10.3)
Chloride: 99 mmol/L — ABNORMAL LOW (ref 101–111)
Creatinine, Ser: 1.15 mg/dL — ABNORMAL HIGH (ref 0.44–1.00)
GFR calc non Af Amer: 54 mL/min — ABNORMAL LOW (ref >60)
Glucose, Bld: 96 mg/dL (ref 65–99)
Potassium: 4.9 mmol/L (ref 3.5–5.1)
SODIUM: 134 mmol/L — AB (ref 135–145)

## 2017-03-23 LAB — CBC
HEMATOCRIT: 36.6 % (ref 35.0–47.0)
HEMOGLOBIN: 11.9 g/dL — AB (ref 12.0–16.0)
MCH: 24.4 pg — ABNORMAL LOW (ref 26.0–34.0)
MCHC: 32.5 g/dL (ref 32.0–36.0)
MCV: 75.1 fL — AB (ref 80.0–100.0)
Platelets: 344 10*3/uL (ref 150–440)
RBC: 4.88 MIL/uL (ref 3.80–5.20)
RDW: 14.6 % — ABNORMAL HIGH (ref 11.5–14.5)
WBC: 5.3 10*3/uL (ref 3.6–11.0)

## 2017-03-23 LAB — TROPONIN I: Troponin I: <0.03 ng/mL (ref <0.03)

## 2017-03-23 MED ORDER — TRAMADOL HCL 50 MG PO TABS: 50.0000 mg | ORAL_TABLET | Freq: Four times a day (QID) | ORAL | 0 refills | Status: AC | PRN

## 2017-03-23 MED ORDER — KETOROLAC TROMETHAMINE 10 MG PO TABS
10.0000 mg | ORAL_TABLET | Freq: Once | ORAL | Status: AC
  Administered 2017-03-23: 10 mg via ORAL
  Filled 2017-03-23: qty 1

## 2017-03-23 NOTE — ED Notes (Signed)

## 2018-12-15 ENCOUNTER — Encounter: Payer: Self-pay | Admitting: Cardiology

## 2018-12-15 ENCOUNTER — Other Ambulatory Visit: Payer: Self-pay

## 2018-12-15 ENCOUNTER — Ambulatory Visit (INDEPENDENT_AMBULATORY_CARE_PROVIDER_SITE_OTHER): Payer: Self-pay | Admitting: Cardiology

## 2018-12-15 ENCOUNTER — Telehealth: Payer: Self-pay | Admitting: Cardiology

## 2018-12-15 VITALS — BP 125/90 | HR 88 | Ht 66.0 in | Wt 304.8 lb

## 2018-12-15 DIAGNOSIS — I10 Essential (primary) hypertension: Secondary | ICD-10-CM

## 2018-12-15 DIAGNOSIS — Z6841 Body Mass Index (BMI) 40.0 and over, adult: Secondary | ICD-10-CM

## 2018-12-15 DIAGNOSIS — R011 Cardiac murmur, unspecified: Secondary | ICD-10-CM

## 2018-12-15 NOTE — Patient Instructions (Addendum)
Medication Instructions:  Continue your current medications.  *If you need a refill on your cardiac medications before your next appointment, please call your pharmacy*  Lab Work: None If you have labs (blood work) drawn today and your tests are completely normal, you will receive your results only by: Marland Kitchen MyChart Message (if you have MyChart) OR . A paper copy in the mail If you have any lab test that is abnormal or we need to change your treatment, we will call you to review the results.  Testing/Procedures: Your physician has requested that you have an echocardiogram. Echocardiography is a painless test that uses sound waves to create images of your heart. It provides your doctor with information about the size and shape of your heart and how well your heart's chambers and valves are working. This procedure takes approximately one hour. There are no restrictions for this procedure.   Follow-Up: At [facility], you and your health needs are our priority.  As part of our continuing mission to provide you with exceptional heart care, we have created designated Provider Care Teams.  These Care Teams include your primary Cardiologist (physician) and Advanced Practice Providers (APPs -  Physician Assistants and Nurse Practitioners) who all work together to provide you with the care you need, when you need it.  Your next appointment: Follow up in 2-3 months.  The format for your next appointment:   In Person  Provider:  Dr. Kate Sable

## 2018-12-15 NOTE — Telephone Encounter (Signed)
Patient calling with updated medication  She says the medication she has are:  Lisinopril 20 MG which we got Amlodipine 5 MG  Please call with any questions

## 2018-12-15 NOTE — Progress Notes (Signed)
Cardiology Office Note:    Date:  27/25/3664   ID:  Pamela Townsend, DOB [birth year], MRN [id number]  PCP:  Ranae Plumber, [place]  Cardiologist:  Kate Sable, MD  Electrophysiologist:  None   Referring MD: Ranae Plumber, [place]   Chief Complaint  Patient presents with  . other    Ref by [facility] for new systolic heart murmur and HTN with obesity. Meds reviewed by the pt. verbally. Pt. c/o shortness of breath with over exertion, gets tired easily and chest discomfort at times.     Pamela Townsend is a 53 y.o. female who is being seen today for the evaluation of heart murmur and hypertension at the request of Ranae Plumber, [place].   History of Present Illness:    Pamela Townsend is a 53 y.o. female with a hx of hypertension, obesity who presents due to cardiac murmur.  Patient went to see her primary care physician for regular visit last week and was told she has a murmur.  She denies any history of heart disease, denies ever smoking.  She denies chest pain or shortness of breath at rest or with exertion.  She has never been told that she has a heart murmur.  She otherwise has no complaints or concerns at this time.  Past Medical History:  Diagnosis Date  . Hypertension     No past surgical history on file.  Current Medications: Current Meds  Medication Sig  . hydrochlorothiazide (HYDRODIURIL) 25 MG tablet Take 1 tablet (25 mg total) by mouth daily.  Marland Kitchen ibuprofen (ADVIL,MOTRIN) 200 MG tablet Take 400 mg by mouth as needed.  Marland Kitchen lisinopril (ZESTRIL) 20 MG tablet Take 20 mg by mouth daily.      Allergies:   Patient has no known allergies.   Social History   Socioeconomic History  . Marital status: Single    Spouse name: Not on file  . Number of children: Not on file  . Years of education: Not on file  . Highest education level: Not on file  Occupational History  . Not on file  Social Needs  . Financial resource strain: Not on file  . Food insecurity   Worry: Not on file    Inability: Not on file  . Transportation needs    Medical: Not on file    Non-medical: Not on file  Tobacco Use  . Smoking status: Never Smoker  . Smokeless tobacco: Never Used  Substance and Sexual Activity  . Alcohol use: Yes  . Drug use: No  . Sexual activity: Never  Lifestyle  . Physical activity    Days per week: Not on file    Minutes per session: Not on file  . Stress: Not on file  Relationships  . Social Herbalist on phone: Not on file    Gets together: Not on file    Attends religious service: Not on file    Active member of club or organization: Not on file    Attends meetings of clubs or organizations: Not on file    Relationship status: Not on file  Other Topics Concern  . Not on file  Social History Narrative  . Not on file     Family History: The patient's family history includes COPD in her mother; Multiple myeloma in her father.  ROS:   Please see the history of present illness.     All other systems reviewed and are negative.  EKGs/Labs/Other Studies Reviewed:  The following studies were reviewed today:   EKG:  EKG is  ordered today.  The ekg ordered today demonstrates normal sinus rhythm, normal ECG.  Recent Labs: No results found for requested labs within last 8760 hours.  Recent Lipid Panel No results found for: CHOL, TRIG, HDL, CHOLHDL, VLDL, LDLCALC, LDLDIRECT  Physical Exam:    VS:  BP 125/90 (BP Location: Right Arm, Patient Position: Sitting, Cuff Size: Large)   Pulse 88   Ht _0  (1.676 m)   Wt (!) 304 lb 12 oz (138.2 kg)   LMP 06/16/2015   BMI 49.19 kg/m     Wt Readings from Last 3 Encounters:  12/15/18 (!) 304 lb 12 oz (138.2 kg)  03/22/17 (!) 310 lb (140.6 kg)  12/15/16 (!) 301 lb (136.5 kg)     GEN:  Well nourished, well developed in no acute distress, obese HEENT: Normal NECK: No JVD; No carotid bruits LYMPHATICS: No lymphadenopathy CARDIAC: RRR, WIOXB3/5 systolic murmur right upper  sternal border, no rubs, no gallops RESPIRATORY:  Clear to auscultation without rales, wheezing or rhonchi  ABDOMEN: Soft, non-tender, non-distended, obese MUSCULOSKELETAL:  No edema; No deformity  SKIN: Warm and dry NEUROLOGIC:  Alert and oriented x 3 PSYCHIATRIC:  Normal affect   ASSESSMENT:   Patient with a faint 1/6 systolic murmur at the right upper sternal border.  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 1. Cardiac murmur   2. Essential hypertension   3. Morbid obesity with BMI of 45.0-49.9, adult ([facility])    PLAN:    In order of problems listed above:  1. Get echocardiogram. 2. Blood pressure well controlled continue lisinopril and HCTZ as documented. 3. Weight loss/calorie cut back advised.  Follow-up after echocardiogram.  Total encounter time more than 45 minutes  Greater than 50% was spent in counseling and coordination of care with the patient  This note was generated in part or whole with voice recognition software. Voice recognition is usually quite accurate but there are transcription errors that can and very often do occur. I apologize for any typographical errors that were not detected and corrected.   Medication Adjustments/Labs and Tests Ordered: Current medicines are reviewed at length with the patient today.  Concerns regarding medicines are outlined above.  Orders Placed This Encounter  Procedures  . EKG 12-Lead  . ECHOCARDIOGRAM COMPLETE   No orders of the defined types were placed in this encounter.   Patient Instructions  Medication Instructions:  Continue your current medications.  *If you need a refill on your cardiac medications before your next appointment, please call your pharmacy*  Lab Work: None If you have labs (blood work) drawn today and your tests are completely normal, you will receive your results only by: Marland Kitchen MyChart Message (if you have MyChart) OR . A paper copy in the mail If you have any lab test that is abnormal or we  need to change your treatment, we will call you to review the results.  Testing/Procedures: Your physician has requested that you have an echocardiogram. Echocardiography is a painless test that uses sound waves to create images of your heart. It provides your doctor with information about the size and shape of your heart and how well your heart's chambers and valves are working. This procedure takes approximately one hour. There are no restrictions for this procedure.   Follow-Up: At [facility], you and your health needs are our priority.  As part of our continuing mission to provide you with exceptional  heart care, we have created designated Provider Care Teams.  These Care Teams include your primary Cardiologist (physician) and Advanced Practice Providers (APPs -  Physician Assistants and Nurse Practitioners) who all work together to provide you with the care you need, when you need it.  Your next appointment: Follow up in 2-3 months.  The format for your next appointment:   In Person  Provider:  Dr. Kate Sable        Signed, Kate Sable, MD  12/15/2018 3:50 PM    [place]

## 2018-12-15 NOTE — Telephone Encounter (Signed)
Patient medication list updated. FYI fwd to Dr. Garen Lah.

## 2018-12-19 ENCOUNTER — Ambulatory Visit: Payer: Self-pay | Attending: Oncology

## 2018-12-19 ENCOUNTER — Other Ambulatory Visit: Payer: Self-pay

## 2018-12-19 ENCOUNTER — Ambulatory Visit
Admission: RE | Admit: 2018-12-19 | Discharge: 2018-12-19 | Disposition: A | Discharge status: Home / Self Care | Payer: Self-pay | Source: Ambulatory Visit | Attending: Oncology | Admitting: Oncology

## 2018-12-19 VITALS — BP 124/93 | HR 86 | Temp 97.7°F | Ht 66.0 in | Wt 306.0 lb

## 2018-12-19 DIAGNOSIS — Z0000 Encounter for general adult medical examination without abnormal findings: Secondary | ICD-10-CM

## 2018-12-19 NOTE — Progress Notes (Signed)
  Subjective:     Patient ID: Pamela Townsend, female   DOB: Jul 15, 1965, 53 y.o.   MRN: [id number]  HPI   Review of Systems     Objective:   Physical Exam Chest:     Breasts:        Right: No swelling, bleeding, inverted nipple, mass, nipple discharge, skin change or tenderness.        Left: No swelling, bleeding, inverted nipple, mass, nipple discharge, skin change or tenderness.        Assessment:    53 year old patient returns for [facility] clinic visit.  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  Patient screened, and meets BCCCP eligibility.  Patient does not have insurance, Medicare or Medicaid. Instructed patient on breast self awareness using teach back method. Clinical breast exam unremarkable. No mass or lump palpated.    Plan:     Sent for bilateral screening mammogram.

## 2019-01-19 ENCOUNTER — Ambulatory Visit (INDEPENDENT_AMBULATORY_CARE_PROVIDER_SITE_OTHER): Payer: Self-pay

## 2019-01-19 ENCOUNTER — Other Ambulatory Visit: Payer: Self-pay

## 2019-01-19 DIAGNOSIS — R011 Cardiac murmur, unspecified: Secondary | ICD-10-CM

## 2019-01-22 ENCOUNTER — Telehealth: Payer: Self-pay | Admitting: *Deleted

## 2019-01-22 NOTE — Telephone Encounter (Signed)
No answer. Left message to call back.   

## 2019-01-22 NOTE — Telephone Encounter (Signed)
-----   Message from Kate Sable, MD sent at 01/22/2019  9:46 AM EST ----- Normal systolic function, normal filling pressures.  Mild mitral regurgitation

## 2019-01-23 NOTE — Telephone Encounter (Signed)
Results called to pt. Pt verbalized understanding.  

## 2019-01-23 NOTE — Telephone Encounter (Signed)
No answer. Left message to call back.   

## 2019-01-23 NOTE — Telephone Encounter (Signed)
Patient returning call.

## 2019-01-29 NOTE — Progress Notes (Signed)
Letter mailed from [facility] to notify of normal mammogram results.  Patient to return in one year for annual screening.  Copy to [facility]. 

## 2019-02-13 ENCOUNTER — Ambulatory Visit: Payer: Self-pay | Admitting: Cardiology

## 2021-06-23 IMAGING — MG DIGITAL SCREENING BILAT W/ TOMO W/ CAD
8 series · 8 of 24 positions shown · non-contrast
Comparison: Previous exam(s).

CLINICAL DATA: Screening.

EXAM:
DIGITAL SCREENING BILATERAL MAMMOGRAM WITH TOMO AND CAD

[R CC synth-2D]
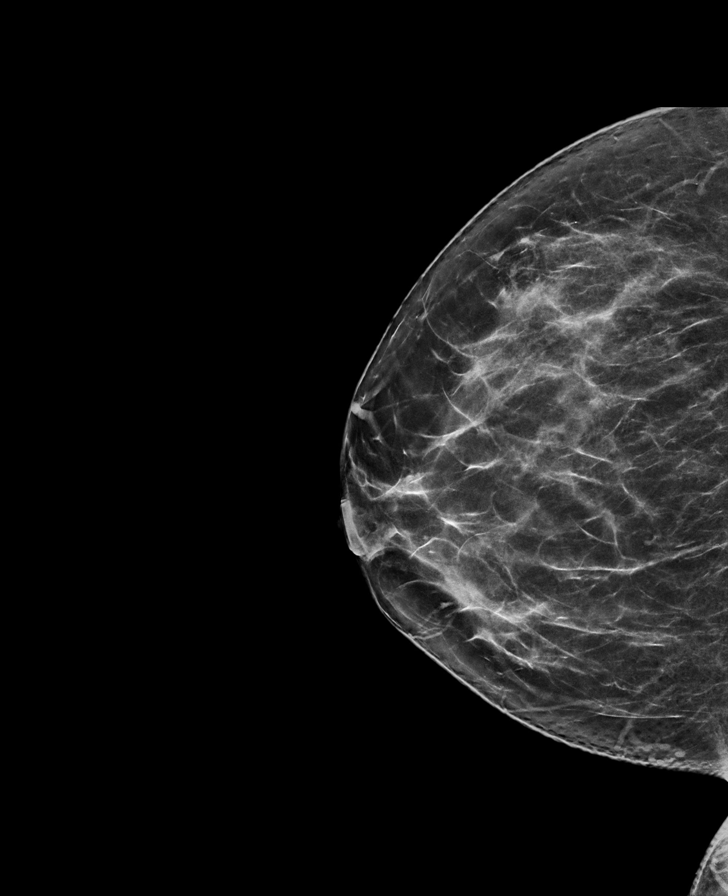

[R MLO synth-2D]
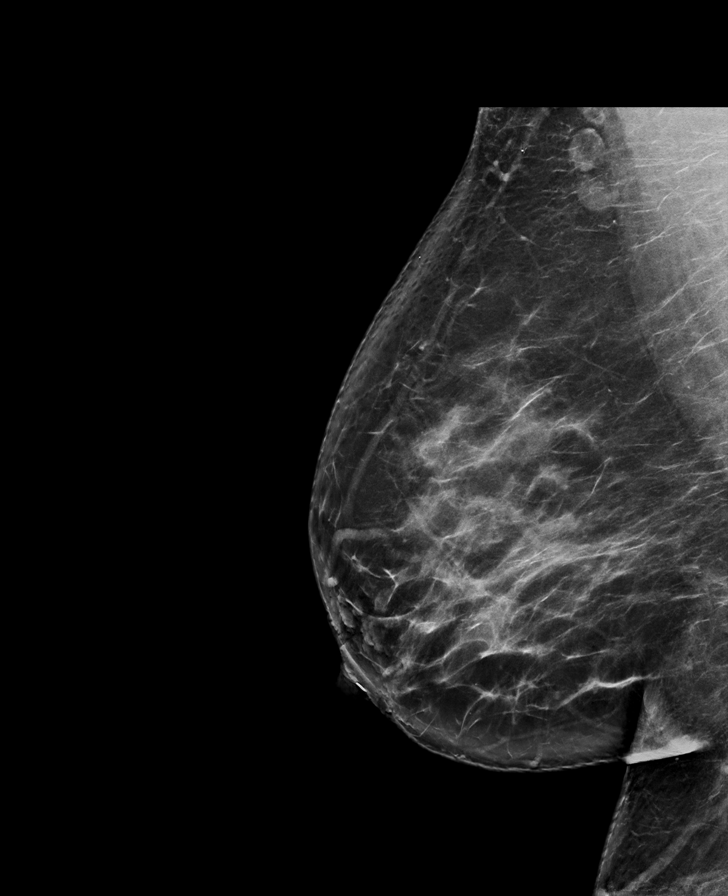

[L MLO synth-2D]
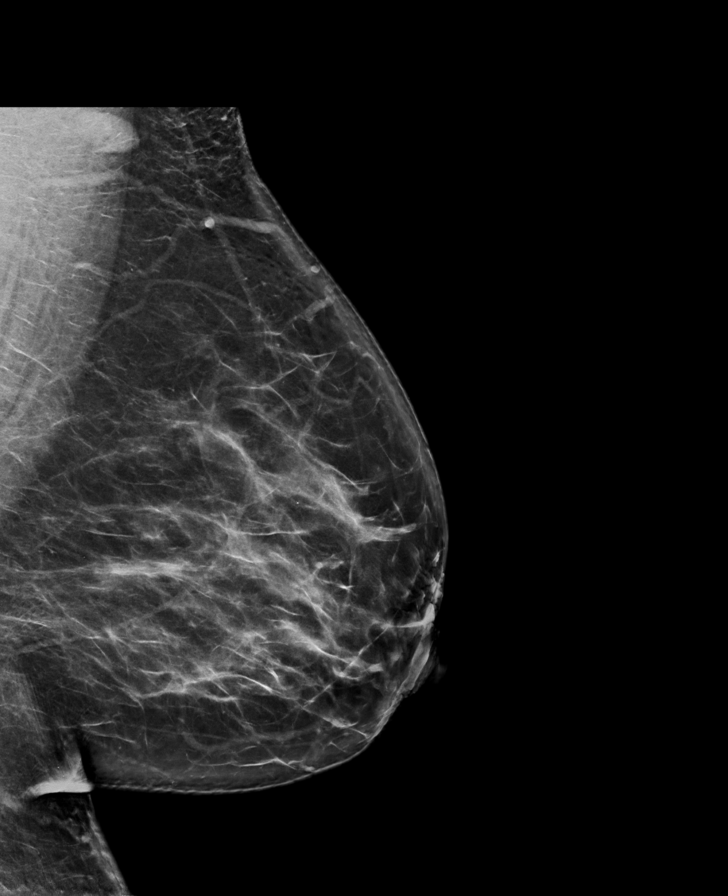

[L CC synth-2D]
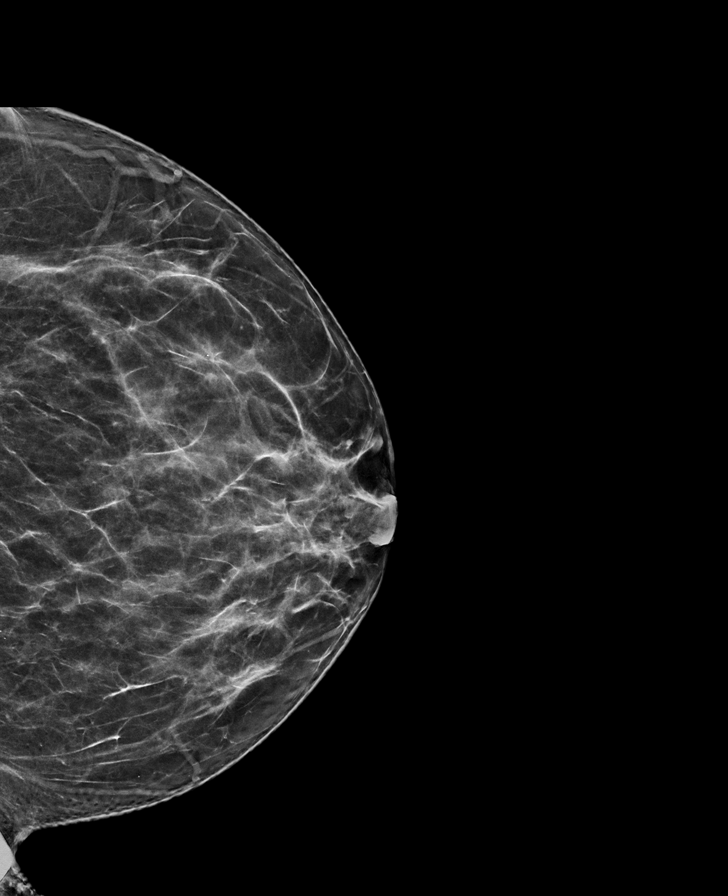

[R CC tomo · tomo slice 36/71.0]
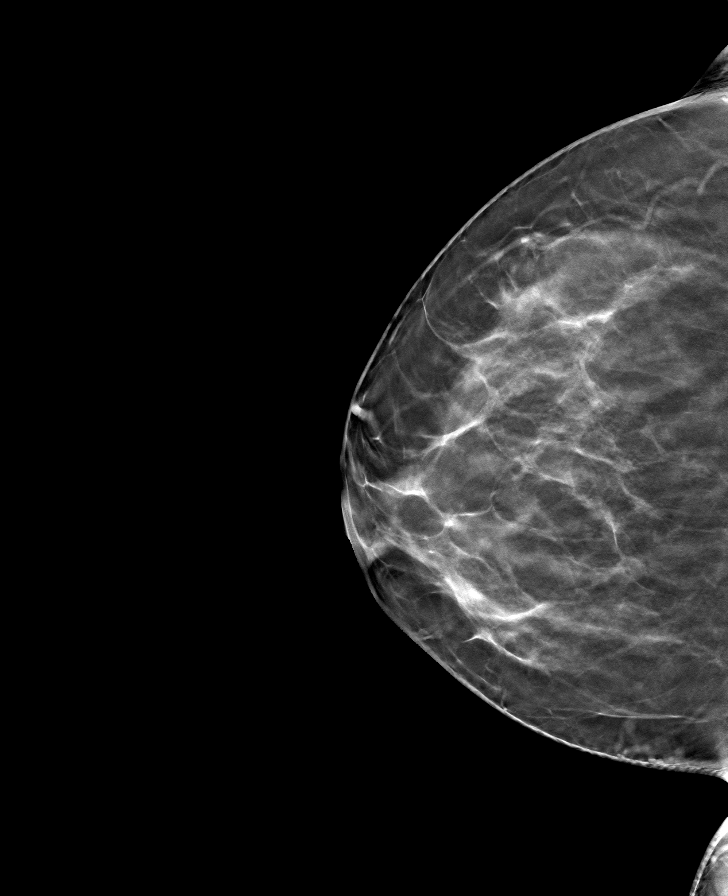

[R MLO tomo · tomo slice 46/91.0]
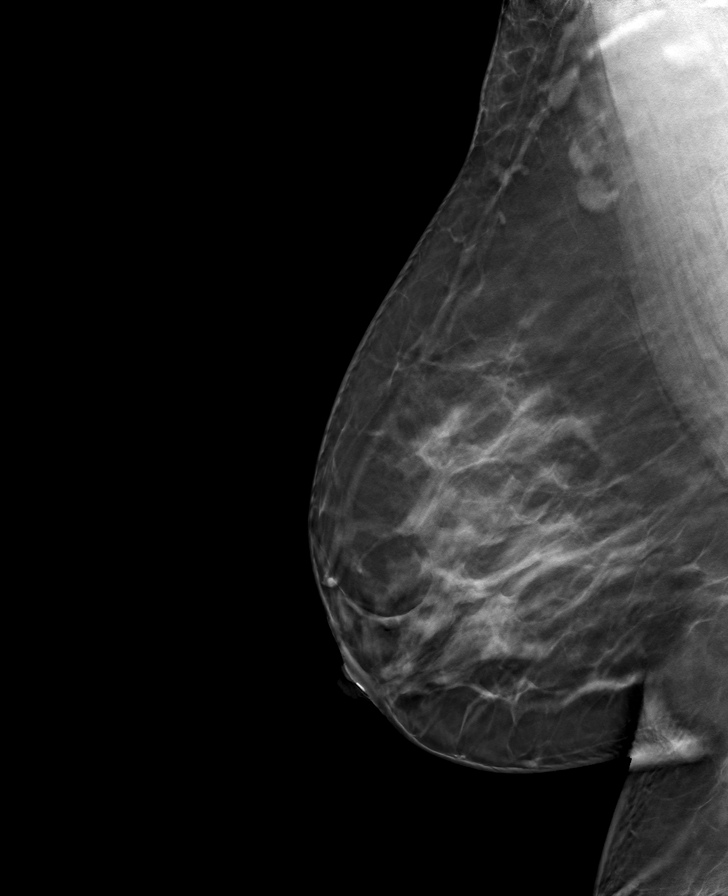

[L MLO tomo · tomo slice 45/89.0]
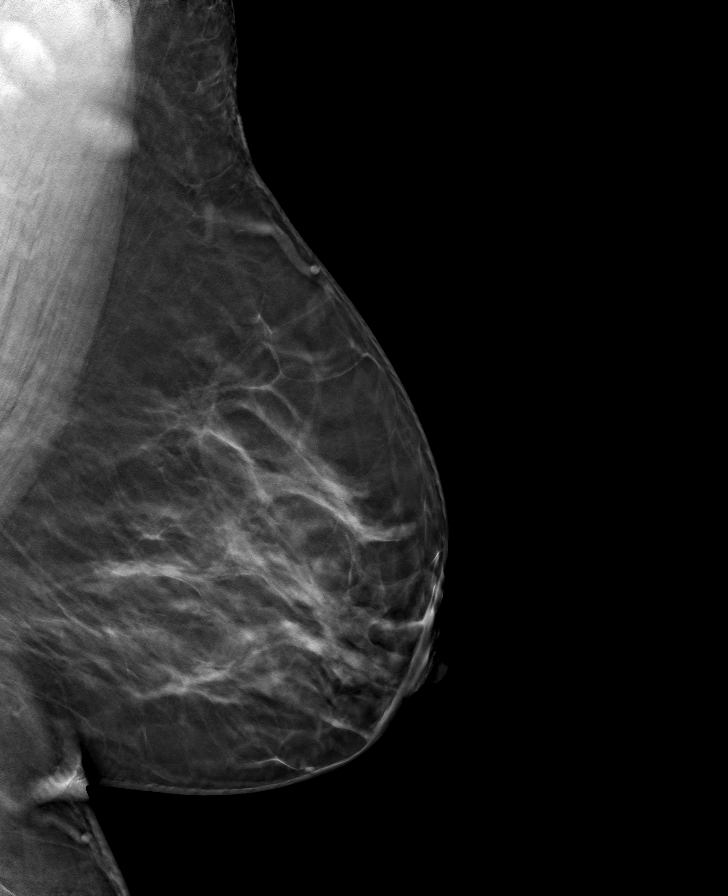

[L CC tomo · tomo slice 35/69.0]
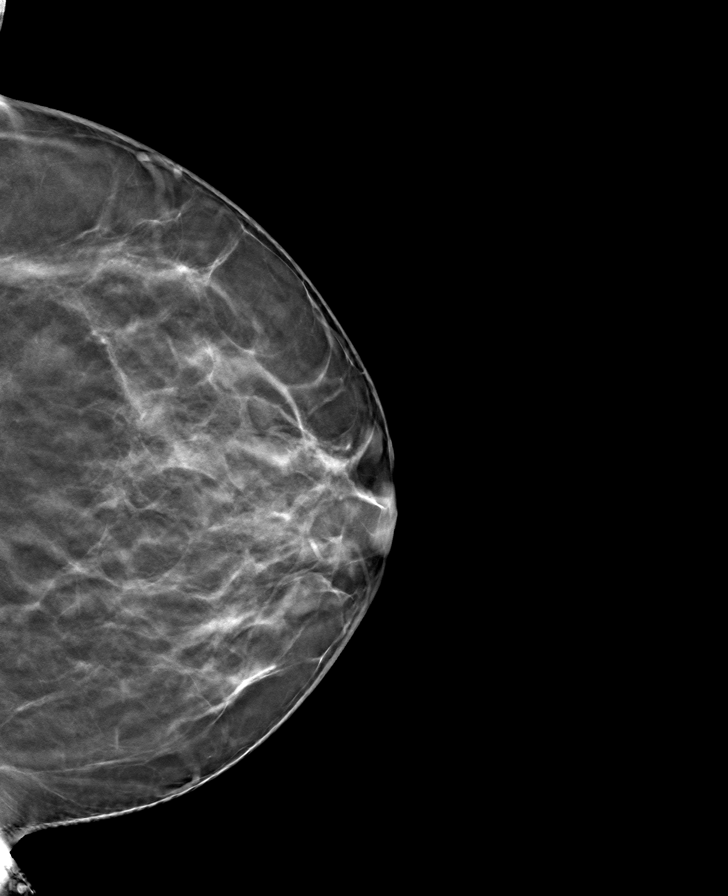

[8 of 24 positions shown; findings below may reference images not displayed]

ACR Breast Density Category b: There are scattered areas of
fibroglandular density.
FINDINGS: There are no findings suspicious for malignancy. Images were
processed with CAD.
IMPRESSION: No mammographic evidence of malignancy. A result letter of this
screening mammogram will be mailed directly to the patient.

RECOMMENDATION:
Screening mammogram in one year. (Code:CN-U-775)

BI-RADS CATEGORY  1: Negative.

## 2022-06-12 ENCOUNTER — Emergency Department
Admission: EM | Admit: 2022-06-12 | Discharge: 2022-06-13 | Disposition: A | Discharge status: Home / Self Care | Payer: Self-pay | Attending: Emergency Medicine | Admitting: Emergency Medicine

## 2022-06-12 ENCOUNTER — Emergency Department: Payer: Self-pay

## 2022-06-12 DIAGNOSIS — L03116 Cellulitis of left lower limb: Secondary | ICD-10-CM

## 2022-06-12 DIAGNOSIS — I10 Essential (primary) hypertension: Secondary | ICD-10-CM | POA: Insufficient documentation

## 2022-06-12 LAB — COMPREHENSIVE METABOLIC PANEL
ALT: 14 U/L (ref 0–44)
AST: 19 U/L (ref 15–41)
Albumin: 3.7 g/dL (ref 3.5–5.0)
Alkaline Phosphatase: 67 U/L (ref 38–126)
Anion gap: 9 (ref 5–15)
BUN: 17 mg/dL (ref 6–20)
CO2: 27 mmol/L (ref 22–32)
Calcium: 9.1 mg/dL (ref 8.9–10.3)
Chloride: 103 mmol/L (ref 98–111)
Creatinine, Ser: 1.39 mg/dL — ABNORMAL HIGH (ref 0.44–1.00)
GFR, Estimated: 44 mL/min — ABNORMAL LOW (ref >60)
Glucose, Bld: 102 mg/dL — ABNORMAL HIGH (ref 70–99)
Potassium: 3.5 mmol/L (ref 3.5–5.1)
Sodium: 139 mmol/L (ref 135–145)
Total Bilirubin: 0.4 mg/dL (ref 0.3–1.2)
Total Protein: 8.3 g/dL — ABNORMAL HIGH (ref 6.5–8.1)

## 2022-06-12 LAB — CBC WITH DIFFERENTIAL/PLATELET
Abs Immature Granulocytes: 0.02 10*3/uL (ref 0.00–0.07)
Basophils Absolute: 0 10*3/uL (ref 0.0–0.1)
Basophils Relative: 0 %
Eosinophils Absolute: 0.1 10*3/uL (ref 0.0–0.5)
Eosinophils Relative: 2 %
HCT: 35.4 % — ABNORMAL LOW (ref 36.0–46.0)
Hemoglobin: 11 g/dL — ABNORMAL LOW (ref 12.0–15.0)
Immature Granulocytes: 0 %
Lymphocytes Relative: 26 %
Lymphs Abs: 1.9 10*3/uL (ref 0.7–4.0)
MCH: 23.8 pg — ABNORMAL LOW (ref 26.0–34.0)
MCHC: 31.1 g/dL (ref 30.0–36.0)
MCV: 76.6 fL — ABNORMAL LOW (ref 80.0–100.0)
Monocytes Absolute: 0.4 10*3/uL (ref 0.1–1.0)
Monocytes Relative: 6 %
Neutro Abs: 4.6 10*3/uL (ref 1.7–7.7)
Neutrophils Relative %: 66 %
Platelets: 386 10*3/uL (ref 150–400)
RBC: 4.62 MIL/uL (ref 3.87–5.11)
RDW: 14.7 % (ref 11.5–15.5)
WBC: 7.1 10*3/uL (ref 4.0–10.5)
nRBC: 0 % (ref 0.0–0.2)

## 2022-06-12 LAB — LACTIC ACID, PLASMA: Lactic Acid, Venous: 1.3 mmol/L (ref 0.5–1.9)

## 2022-06-12 MED ORDER — OXYCODONE HCL 5 MG PO TABS
5.0000 mg | ORAL_TABLET | Freq: Once | ORAL | Status: AC
  Administered 2022-06-13: 5 mg via ORAL
  Filled 2022-06-12: qty 1

## 2022-06-12 MED ORDER — CEPHALEXIN 500 MG PO CAPS
500.0000 mg | ORAL_CAPSULE | Freq: Once | ORAL | Status: AC
  Administered 2022-06-13: 500 mg via ORAL
  Filled 2022-06-12: qty 1

## 2022-06-12 MED ORDER — ACETAMINOPHEN 500 MG PO TABS
1000.0000 mg | ORAL_TABLET | Freq: Once | ORAL | Status: AC
  Administered 2022-06-13: 1000 mg via ORAL
  Filled 2022-06-12: qty 2

## 2022-06-12 NOTE — ED Triage Notes (Signed)
Pt presents to the ED due to leg pain in L leg. Pt states it is tender to touch and warm. Pt denies any trauma to leg. Pt's leg appears lto be swollen, red, tender and warm to touch. Pt is A&Ox4 Pt NAD

## 2022-06-12 NOTE — ED Provider Notes (Signed)
Pamela Townsend Provider Note    Event Date/Time   First MD Initiated Contact with Patient 06/12/22 2334     (approximate)   History   Leg Pain   HPI  Pamela Townsend is a 57 y.o. female who presents to the ED for evaluation of Leg Pain   I review cardiology clinic visit from 2020. Obese pt with hx HTN,   Patient presents to the ED for evaluation of atraumatic distal left leg redness, swelling and erythema.  Symptoms for the past 3 to 4 days.  No fevers or systemic symptoms and no known traumas.  Reports swelling, erythema, warmth and pain.  No recent antibiotics.  This is never happened before.   Physical Exam   Triage Vital Signs: ED Triage Vitals  Enc Vitals Group     BP 06/12/22 1840 (!) 175/110     Pulse Rate 06/12/22 1840 (!) 112     Resp 06/12/22 1840 18     Temp 06/12/22 1840 98.3 F (36.8 C)     Temp Source 06/12/22 1840 Oral     SpO2 06/12/22 1840 98 %     Weight 06/12/22 1842 (!) 306 lb (138.8 kg)     Height --      Head Circumference --      Peak Flow --      Pain Score 06/12/22 1841 5     Pain Loc --      Pain Edu? --      Excl. in GC? --     Most recent vital signs: Vitals:   06/13/22 0000 06/13/22 0100  BP: (!) 154/97 (!) 147/107  Pulse: 92 96  Resp:  18  Temp:    SpO2: 98% 96%    General: Awake, no distress.  Morbidly obese, well-appearing.  Able to take her own pants off to change into a gown for me. CV:  Good peripheral perfusion.  Resp:  Normal effort.  Abd:  No distention.  MSK:  Anterior distal left shin demonstrates erythema, induration, warmth and mild tenderness.  Left foot is distally neurovascularly intact and symmetric DP pulses and cap refill when compared to the right. Neuro:  No focal deficits appreciated. Other:     ED Results / Procedures / Treatments   Labs (all labs ordered are listed, but only abnormal results are displayed) Labs Reviewed  COMPREHENSIVE METABOLIC PANEL - Abnormal; Notable for  the following components:      Result Value   Glucose, Bld 102 (*)    Creatinine, Ser 1.39 (*)    Total Protein 8.3 (*)    GFR, Estimated 44 (*)    All other components within normal limits  CBC WITH DIFFERENTIAL/PLATELET - Abnormal; Notable for the following components:   Hemoglobin 11.0 (*)    HCT 35.4 (*)    MCV 76.6 (*)    MCH 23.8 (*)    All other components within normal limits  LACTIC ACID, PLASMA  LACTIC ACID, PLASMA    EKG   RADIOLOGY DVT ultrasound interpreted by me without evidence of DVT  Official radiology report(s): US Venous Img Lower Unilateral Left (DVT)  Result Date: 06/13/2022 CLINICAL DATA:  Left calf pain EXAM: LEFT LOWER EXTREMITY VENOUS DOPPLER ULTRASOUND TECHNIQUE: Gray-scale sonography with graded compression, as well as color Doppler and duplex ultrasound were performed to evaluate the lower extremity deep venous systems from the level of the common femoral vein and including the common femoral, femoral, profunda femoral, popliteal and  calf veins including the posterior tibial, peroneal and gastrocnemius veins when visible. The superficial great saphenous vein was also interrogated. Spectral Doppler was utilized to evaluate flow at rest and with distal augmentation maneuvers in the common femoral, femoral and popliteal veins. COMPARISON:  None Available. FINDINGS: Contralateral Common Femoral Vein: Respiratory phasicity is normal and symmetric with the symptomatic side. No evidence of thrombus. Normal compressibility. Common Femoral Vein: No evidence of thrombus. Normal compressibility, respiratory phasicity and response to augmentation. Saphenofemoral Junction: No evidence of thrombus. Normal compressibility and flow on color Doppler imaging. Profunda Femoral Vein: No evidence of thrombus. Normal compressibility and flow on color Doppler imaging. Femoral Vein: No evidence of thrombus. Normal compressibility, respiratory phasicity and response to augmentation.  Popliteal Vein: No evidence of thrombus. Normal compressibility, respiratory phasicity and response to augmentation. Calf Veins: No evidence of thrombus. Normal compressibility and flow on color Doppler imaging. Superficial Great Saphenous Vein: No evidence of thrombus. Normal compressibility. Venous Reflux:  None. Other Findings:  None. IMPRESSION: No evidence of deep venous thrombosis. Electronically Signed   By: Alcide Clever M.D.   On: 06/13/2022 00:34    PROCEDURES and INTERVENTIONS:  Procedures  Medications  acetaminophen (TYLENOL) tablet 1,000 mg (1,000 mg Oral Given 06/13/22 0023)  oxyCODONE (Oxy IR/ROXICODONE) immediate release tablet 5 mg (5 mg Oral Given 06/13/22 0024)  cephALEXin (KEFLEX) capsule 500 mg (500 mg Oral Given 06/13/22 0026)     IMPRESSION / MDM / ASSESSMENT AND PLAN / ED COURSE  I reviewed the triage vital signs and the nursing notes.  Differential diagnosis includes, but is not limited to, DVT, cellulitis, abscess, osteomyelitis, burn  {Patient presents with symptoms of an acute illness or injury that is potentially life-threatening.  57 year old woman presents to the ED with evidence of cellulitis to her legs suitable for outpatient management with antibiotics.  Triage tachycardia is noted but no other SIRS criteria to suggest sepsis.  She also looks systemically well and has no systemic symptoms.  Normal lactic acid.  DVT ultrasound is negative and I suspect cellulitis.  She started on Keflex and discharged with the remainder of a course.  Discussed return precautions.  Clinical Course as of 06/13/22 0558  Sun Jun 13, 2022  0112 Reassessed and discussed reassuring ultrasound.  Discussed cellulitis and return precautions.  Answered questions. [DS]    Clinical Course User Index [DS] Pamela Prairie, MD     FINAL CLINICAL IMPRESSION(S) / ED DIAGNOSES   Final diagnoses:  Cellulitis of left lower extremity     Rx / DC Orders   ED Discharge Orders           Ordered    cephALEXin (KEFLEX) 500 MG capsule  3 times daily        06/13/22 0107             Note:  This document was prepared using Dragon voice recognition software and may include unintentional dictation errors.   Pamela Prairie, MD 06/13/22 [phone]

## 2022-06-13 MED ORDER — CEPHALEXIN 500 MG PO CAPS: 500.0000 mg | ORAL_CAPSULE | Freq: Three times a day (TID) | ORAL | 0 refills | Status: AC
